# Patient Record
Sex: Male | Born: 2000 | Race: Black or African American | Hispanic: No | Marital: Single | Smoking: Current every day smoker
Health system: Southern US, Community
[De-identification: ages and names within clinical notes are randomized; demographics above are authoritative.]

---
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
## 2019-04-03 ENCOUNTER — Encounter (HOSPITAL_COMMUNITY): Payer: Self-pay | Admitting: Emergency Medicine

## 2019-04-03 ENCOUNTER — Ambulatory Visit (HOSPITAL_COMMUNITY)
Admission: EM | Admit: 2019-04-03 | Discharge: 2019-04-03 | Discharge status: Against Medical Advice | Payer: Self-pay | Attending: Internal Medicine | Admitting: Internal Medicine

## 2019-04-03 ENCOUNTER — Other Ambulatory Visit: Payer: Self-pay

## 2019-04-03 DIAGNOSIS — R300 Dysuria: Secondary | ICD-10-CM

## 2019-04-03 LAB — POCT URINALYSIS DIP (DEVICE)
Bilirubin Urine: NEGATIVE
Glucose, UA: NEGATIVE mg/dL
Ketones, ur: NEGATIVE mg/dL
Nitrite: NEGATIVE
Protein, ur: 30 mg/dL — AB
Specific Gravity, Urine: >=1.03 (ref 1.005–1.030)
Urobilinogen, UA: 1 mg/dL (ref 0.0–1.0)
pH: 6.5 (ref 5.0–8.0)

## 2019-04-03 NOTE — ED Provider Notes (Signed)
Patient left before being seen.   Jaynee Eagles, PA-C 04/03/19 1250

## 2019-04-03 NOTE — ED Notes (Signed)
Provider entered exam room; pt not found.

## 2019-04-03 NOTE — ED Triage Notes (Addendum)
Burning with urination. Denies penile discharge.  03/29/2019 is when symptoms started.    Patient says he has not been sexually active

## 2019-04-03 NOTE — ED Notes (Signed)
Provider once again in exam room with pt nowhere to be found.

## 2023-08-19 ENCOUNTER — Ambulatory Visit
Admission: EM | Admit: 2023-08-19 | Discharge: 2023-08-19 | Disposition: A | Discharge status: Home / Self Care | Payer: BLUE CROSS/BLUE SHIELD

## 2023-08-19 DIAGNOSIS — B370 Candidal stomatitis: Secondary | ICD-10-CM | POA: Diagnosis not present

## 2023-08-19 MED ORDER — NYSTATIN 100000 UNIT/ML MT SUSP: 500000.0000 [IU] | Freq: Four times a day (QID) | OROMUCOSAL | 0 refills | Status: AC

## 2023-08-19 NOTE — ED Triage Notes (Signed)
"  Friday I caught a cold and getting over it but I think I have oral thrush, I see white things in the inside of my mouth in varies areas". No rash. No fever. No sore throat.

## 2023-08-26 NOTE — ED Provider Notes (Signed)
 [facility]    CSN: [id number] Arrival date & time: 08/19/23  1215      History   Chief Complaint Chief Complaint  Patient presents with   Oral Problem    HPI Bradley Hess is a 23 y.o. male.   Patient here today for evaluation of suspected thrush. He reports that he is getting white areas in his mouth. He denies any other rash, fever, and sore throat.   The history is provided by the patient.    History reviewed. No pertinent past medical history.  There are no active problems to display for this patient.   History reviewed. No pertinent surgical history.     Home Medications    Prior to Admission medications   Medication Sig Start Date End Date Taking? Authorizing Provider  Chlorphen-Pseudoephed-APAP [facility] FLU/COLD PO) Take by mouth. Last dose: Sunday.   Yes [provider]  nystatin (MYCOSTATIN) 100000 UNIT/ML suspension Use as directed 5 mLs (500,000 Units total) in the mouth or throat 4 (four) times daily. 08/19/23  Yes Tomi Bamberger, PA-C  UNABLE TO FIND Med Name: Luster Landsberg and Honey Cough Drops.   Yes [provider]    Family History Family History  Problem Relation Age of Onset   Healthy Mother    Healthy Father     Social History Social History   Tobacco Use   Smoking status: Every Day    Types: Cigarettes  Vaping Use   Vaping status: Never Used  Substance Use Topics   Alcohol use: No   Drug use: Not Currently    Types: Marijuana     Allergies   Penicillins   Review of Systems Review of Systems  Constitutional:  Negative for chills and fever.  HENT:  Negative for congestion, ear pain and sore throat.   Eyes:  Negative for discharge and redness.  Respiratory:  Negative for cough and shortness of breath.   Gastrointestinal:  Negative for abdominal pain, nausea and vomiting.     Physical Exam Triage Vital Signs ED Triage Vitals  Encounter Vitals Group     BP 08/19/23 1237 104/68     Systolic  BP Percentile --      Diastolic BP Percentile --      Pulse Rate 08/19/23 1237 80     Resp 08/19/23 1237 18     Temp 08/19/23 1237 99.2 F (37.3 C)     Temp Source 08/19/23 1237 Oral     SpO2 08/19/23 1237 98 %     Weight 08/19/23 1234 155 lb (70.3 kg)     Height 08/19/23 1234 6\' 2"  (1.88 m)     Head Circumference --      Peak Flow --      Pain Score 08/19/23 1233 0     Pain Loc --      Pain Education --      Exclude from Growth Chart --    No data found.  Updated Vital Signs BP 104/68 (BP Location: Left Arm)   Pulse 80   Temp 99.2 F (37.3 C) (Oral)   Resp 18   Ht 6\' 2"  (1.88 m)   Wt 155 lb (70.3 kg)   SpO2 98%   BMI 19.90 kg/m   Visual Acuity Right Eye Distance:   Left Eye Distance:   Bilateral Distance:    Right Eye Near:   Left Eye Near:    Bilateral Near:     Physical Exam Vitals and nursing note  reviewed.  Constitutional:      General: He is not in acute distress.    Appearance: Normal appearance. He is not ill-appearing.  HENT:     Head: Normocephalic and atraumatic.     Nose: Nose normal. No congestion.     Mouth/Throat:     Mouth: Mucous membranes are moist.     Comments: Few white plaques in oral cavity Eyes:     Conjunctiva/sclera: Conjunctivae normal.  Cardiovascular:     Rate and Rhythm: Normal rate.  Pulmonary:     Effort: Pulmonary effort is normal. No respiratory distress.  Skin:    General: Skin is warm and dry.  Neurological:     Mental Status: He is alert.  Psychiatric:        Mood and Affect: Mood normal.        Thought Content: Thought content normal.      UC Treatments / Results  Labs (all labs ordered are listed, but only abnormal results are displayed) Labs Reviewed - No data to display  EKG   Radiology No results found.  Procedures Procedures (including critical care time)  Medications Ordered in UC Medications - No data to display  Initial Impression / Assessment and Plan / UC Course  I have reviewed the  triage vital signs and the nursing notes.  Pertinent labs & imaging results that were available during my care of the patient were reviewed by me and considered in my medical decision making (see chart for details).    Will treat to cover thrush. Advised follow up if no gradual improvement or with any further concerns.   Final Clinical Impressions(s) / UC Diagnoses   Final diagnoses:  Thrush   Discharge Instructions   None    ED Prescriptions     Medication Sig Dispense Auth. Provider   nystatin (MYCOSTATIN) 100000 UNIT/ML suspension Use as directed 5 mLs (500,000 Units total) in the mouth or throat 4 (four) times daily. 60 mL Tomi Bamberger, PA-C      PDMP not reviewed this encounter.   Tomi Bamberger, PA-C 08/26/23 [phone]
